# Patient Record
Sex: Female | Born: 1975 | Race: Black or African American | Hispanic: No | Marital: Single | Smoking: Never smoker
Health system: Southern US, Community
[De-identification: ages and names within clinical notes are randomized; demographics above are authoritative.]

## PROBLEM LIST (undated history)

## (undated) DIAGNOSIS — G8929 Other chronic pain: Secondary | ICD-10-CM

## (undated) DIAGNOSIS — F419 Anxiety disorder, unspecified: Secondary | ICD-10-CM

## (undated) DIAGNOSIS — D649 Anemia, unspecified: Secondary | ICD-10-CM

## (undated) DIAGNOSIS — N390 Urinary tract infection, site not specified: Secondary | ICD-10-CM

## (undated) DIAGNOSIS — I10 Essential (primary) hypertension: Secondary | ICD-10-CM

## (undated) DIAGNOSIS — R51 Headache: Secondary | ICD-10-CM

## (undated) DIAGNOSIS — L309 Dermatitis, unspecified: Secondary | ICD-10-CM

## (undated) HISTORY — DX: Dermatitis, unspecified: L30.9

## (undated) HISTORY — DX: Anxiety disorder, unspecified: F41.9

## (undated) HISTORY — DX: Headache: R51

## (undated) HISTORY — PX: TUBAL LIGATION: SHX77

## (undated) HISTORY — DX: Urinary tract infection, site not specified: N39.0

## (undated) HISTORY — DX: Essential (primary) hypertension: I10

## (undated) HISTORY — DX: Other chronic pain: G89.29

## (undated) HISTORY — DX: Anemia, unspecified: D64.9

---

## 1997-11-27 ENCOUNTER — Emergency Department (HOSPITAL_COMMUNITY): Admission: EM | Admit: 1997-11-27 | Discharge: 1997-11-27 | Payer: Self-pay | Admitting: Internal Medicine

## 1998-11-28 ENCOUNTER — Encounter: Payer: Self-pay | Admitting: Obstetrics

## 1998-11-28 ENCOUNTER — Inpatient Hospital Stay (HOSPITAL_COMMUNITY): Admission: AD | Admit: 1998-11-28 | Discharge: 1998-11-28 | Payer: Self-pay | Admitting: Obstetrics

## 1998-11-29 ENCOUNTER — Inpatient Hospital Stay (HOSPITAL_COMMUNITY): Admission: AD | Admit: 1998-11-29 | Discharge: 1998-11-29 | Payer: Self-pay | Admitting: Obstetrics

## 1998-12-05 ENCOUNTER — Ambulatory Visit (HOSPITAL_COMMUNITY): Admission: RE | Admit: 1998-12-05 | Discharge: 1998-12-05 | Payer: Self-pay | Admitting: Obstetrics

## 1999-06-09 ENCOUNTER — Inpatient Hospital Stay (HOSPITAL_COMMUNITY): Admission: AD | Admit: 1999-06-09 | Discharge: 1999-06-11 | Payer: Self-pay | Admitting: Obstetrics

## 1999-06-12 ENCOUNTER — Encounter: Admission: RE | Admit: 1999-06-12 | Discharge: 1999-08-22 | Payer: Self-pay | Admitting: Obstetrics

## 2000-04-30 ENCOUNTER — Emergency Department (HOSPITAL_COMMUNITY): Admission: EM | Admit: 2000-04-30 | Discharge: 2000-04-30 | Payer: Self-pay | Admitting: *Deleted

## 2005-01-26 ENCOUNTER — Ambulatory Visit: Payer: Self-pay | Admitting: Certified Nurse Midwife

## 2005-01-26 ENCOUNTER — Inpatient Hospital Stay (HOSPITAL_COMMUNITY): Admission: AD | Admit: 2005-01-26 | Discharge: 2005-01-29 | Payer: Self-pay | Admitting: *Deleted

## 2005-02-08 ENCOUNTER — Ambulatory Visit: Payer: Self-pay | Admitting: Family Medicine

## 2005-02-22 ENCOUNTER — Ambulatory Visit: Payer: Self-pay | Admitting: Family Medicine

## 2005-02-23 ENCOUNTER — Ambulatory Visit: Payer: Self-pay | Admitting: Obstetrics & Gynecology

## 2005-02-23 ENCOUNTER — Inpatient Hospital Stay (HOSPITAL_COMMUNITY): Admission: AD | Admit: 2005-02-23 | Discharge: 2005-02-25 | Payer: Self-pay | Admitting: Obstetrics & Gynecology

## 2005-03-01 ENCOUNTER — Ambulatory Visit: Payer: Self-pay | Admitting: Family Medicine

## 2005-03-08 ENCOUNTER — Ambulatory Visit: Payer: Self-pay | Admitting: *Deleted

## 2005-03-19 ENCOUNTER — Inpatient Hospital Stay (HOSPITAL_COMMUNITY): Admission: AD | Admit: 2005-03-19 | Discharge: 2005-03-19 | Payer: Self-pay | Admitting: *Deleted

## 2005-03-19 ENCOUNTER — Ambulatory Visit: Payer: Self-pay | Admitting: Family Medicine

## 2005-03-22 ENCOUNTER — Ambulatory Visit: Payer: Self-pay | Admitting: Family Medicine

## 2005-04-12 ENCOUNTER — Ambulatory Visit: Payer: Self-pay | Admitting: Family Medicine

## 2005-04-19 ENCOUNTER — Ambulatory Visit: Payer: Self-pay | Admitting: *Deleted

## 2005-04-24 ENCOUNTER — Inpatient Hospital Stay (HOSPITAL_COMMUNITY): Admission: AD | Admit: 2005-04-24 | Discharge: 2005-04-24 | Payer: Self-pay | Admitting: *Deleted

## 2005-04-24 ENCOUNTER — Inpatient Hospital Stay (HOSPITAL_COMMUNITY): Admission: AD | Admit: 2005-04-24 | Discharge: 2005-04-27 | Payer: Self-pay | Admitting: *Deleted

## 2005-04-24 ENCOUNTER — Ambulatory Visit: Payer: Self-pay | Admitting: *Deleted

## 2010-06-21 ENCOUNTER — Emergency Department (HOSPITAL_COMMUNITY)
Admission: EM | Admit: 2010-06-21 | Discharge: 2010-06-21 | Disposition: A | Discharge status: Home / Self Care | Payer: Managed Care, Other (non HMO) | Attending: Emergency Medicine | Admitting: Emergency Medicine

## 2010-06-21 DIAGNOSIS — I10 Essential (primary) hypertension: Secondary | ICD-10-CM | POA: Insufficient documentation

## 2010-06-21 DIAGNOSIS — R55 Syncope and collapse: Secondary | ICD-10-CM | POA: Insufficient documentation

## 2010-06-21 DIAGNOSIS — R42 Dizziness and giddiness: Secondary | ICD-10-CM | POA: Insufficient documentation

## 2010-06-21 LAB — CBC
HCT: 34.7 % — ABNORMAL LOW (ref 36.0–46.0)
Hemoglobin: 11.8 g/dL — ABNORMAL LOW (ref 12.0–15.0)
MCH: 26.1 pg (ref 26.0–34.0)
MCHC: 34 g/dL (ref 30.0–36.0)
RBC: 4.52 MIL/uL (ref 3.87–5.11)
WBC: 6.4 10*3/uL (ref 4.0–10.5)

## 2010-06-21 LAB — DIFFERENTIAL
Basophils Relative: 0 % (ref 0–1)
Eosinophils Absolute: 0 10*3/uL (ref 0.0–0.7)
Eosinophils Relative: 0 % (ref 0–5)
Lymphs Abs: 2.5 10*3/uL (ref 0.7–4.0)

## 2010-06-21 LAB — URINALYSIS, ROUTINE W REFLEX MICROSCOPIC
Nitrite: NEGATIVE
Protein, ur: NEGATIVE mg/dL
Urine Glucose, Fasting: NEGATIVE mg/dL

## 2010-06-21 LAB — POCT CARDIAC MARKERS
Myoglobin, poc: 45.8 ng/mL (ref 12–200)
Troponin i, poc: <0.05 ng/mL (ref 0.00–0.09)

## 2010-06-21 LAB — POCT I-STAT, CHEM 8
Calcium, Ion: 1.25 mmol/L (ref 1.12–1.32)
Chloride: 103 mEq/L (ref 96–112)
Glucose, Bld: 95 mg/dL (ref 70–99)
HCT: 39 % (ref 36.0–46.0)
Hemoglobin: 13.3 g/dL (ref 12.0–15.0)
TCO2: 25 mmol/L (ref 0–100)

## 2010-06-21 LAB — GLUCOSE, CAPILLARY: Glucose-Capillary: 100 mg/dL — ABNORMAL HIGH (ref 70–99)

## 2010-12-01 ENCOUNTER — Emergency Department (HOSPITAL_COMMUNITY)
Admission: EM | Admit: 2010-12-01 | Discharge: 2010-12-01 | Disposition: A | Discharge status: Home / Self Care | Payer: Managed Care, Other (non HMO) | Attending: Emergency Medicine | Admitting: Emergency Medicine

## 2010-12-01 DIAGNOSIS — M7989 Other specified soft tissue disorders: Secondary | ICD-10-CM | POA: Insufficient documentation

## 2010-12-01 DIAGNOSIS — I10 Essential (primary) hypertension: Secondary | ICD-10-CM | POA: Insufficient documentation

## 2010-12-01 DIAGNOSIS — M25579 Pain in unspecified ankle and joints of unspecified foot: Secondary | ICD-10-CM | POA: Insufficient documentation

## 2010-12-01 DIAGNOSIS — L659 Nonscarring hair loss, unspecified: Secondary | ICD-10-CM | POA: Insufficient documentation

## 2010-12-01 DIAGNOSIS — R209 Unspecified disturbances of skin sensation: Secondary | ICD-10-CM | POA: Insufficient documentation

## 2010-12-01 DIAGNOSIS — L851 Acquired keratosis [keratoderma] palmaris et plantaris: Secondary | ICD-10-CM | POA: Insufficient documentation

## 2010-12-01 DIAGNOSIS — M2550 Pain in unspecified joint: Secondary | ICD-10-CM | POA: Insufficient documentation

## 2010-12-01 DIAGNOSIS — R5383 Other fatigue: Secondary | ICD-10-CM | POA: Insufficient documentation

## 2010-12-01 DIAGNOSIS — R5381 Other malaise: Secondary | ICD-10-CM | POA: Insufficient documentation

## 2010-12-01 LAB — POCT I-STAT, CHEM 8
BUN: 8 mg/dL (ref 6–23)
Calcium, Ion: 1.28 mmol/L (ref 1.12–1.32)
HCT: 33 % — ABNORMAL LOW (ref 36.0–46.0)
Hemoglobin: 11.2 g/dL — ABNORMAL LOW (ref 12.0–15.0)
Sodium: 141 mEq/L (ref 135–145)
TCO2: 25 mmol/L (ref 0–100)

## 2010-12-01 LAB — POCT PREGNANCY, URINE: Preg Test, Ur: NEGATIVE

## 2010-12-04 ENCOUNTER — Encounter: Payer: Self-pay | Admitting: Medical

## 2010-12-04 ENCOUNTER — Ambulatory Visit (INDEPENDENT_AMBULATORY_CARE_PROVIDER_SITE_OTHER): Payer: Managed Care, Other (non HMO) | Admitting: Medical

## 2010-12-04 VITALS — BP 102/70 | HR 60 | Temp 98.0°F | Ht 66.0 in | Wt 124.0 lb

## 2010-12-04 DIAGNOSIS — R609 Edema, unspecified: Secondary | ICD-10-CM

## 2010-12-04 DIAGNOSIS — D649 Anemia, unspecified: Secondary | ICD-10-CM

## 2010-12-04 DIAGNOSIS — R634 Abnormal weight loss: Secondary | ICD-10-CM

## 2010-12-04 DIAGNOSIS — R5381 Other malaise: Secondary | ICD-10-CM

## 2010-12-04 DIAGNOSIS — R5383 Other fatigue: Secondary | ICD-10-CM

## 2010-12-04 LAB — POCT URINALYSIS DIPSTICK
Bilirubin, UA: NEGATIVE
Blood, UA: NEGATIVE
Glucose, UA: NEGATIVE
Leukocytes, UA: NEGATIVE
Nitrite, UA: NEGATIVE
Urobilinogen, UA: NEGATIVE
pH, UA: 6

## 2010-12-04 NOTE — Progress Notes (Signed)
Subjective:   HPI  Jennifer Solomon is a 35 y.o. female who presents as a new patient for hospital f/u.   Was seen 12/01/10 at [facility] for feet and hand swelling and feet tingling, stiffness in joints.  She notes that no etiology was determined, and advised to establish with primary doctor to further investigate.  Since the ED visit, has tried to elevated legs which has helped.  Was given Ibuprofen at the hospital for soreness which has helped.  Currently things seem to be back to normal.   No other aggravating or relieving factors.  No other c/o.  She does note hx/o being on iron therapy for anemia.  She was anemic in all 3 of her pregnancies.  She denies hx/o colonoscopy or EGD.  Last pap smear 5 years ago. No prior mammogram.  In general she notes that she eats healthy.  The following portions of the patient's history were reviewed and updated as appropriate: allergies, current medications, past family history, past medical history, past social history, past surgical history and problem list.  Past Medical History  Diagnosis Date  . Hypertension   . Urinary tract infection, chronic   . Chronic headaches   . Anemia   . Anxiety     Review of Systems Constitutional: +fatigue, night sweats, some wt loss 10lb since April, decreased appetite; denies fever, chills Allergy: no congestion, sneezing Dermatology: mild eczema ENT: no runny nose, ear pain, sore throat, hoarseness, sinus pain Cardiology: denies chest pain, palpitations Respiratory: denies cough, shortness of breath, wheezing Gastroenterology: denies abdominal pain, nausea, vomiting, diarrhea, constipation Hematology: denies bleeding or bruising problems Musculoskeletal: denies arthralgias, myalgias, joint swelling, back pain Ophthalmology: denies vision changes Urology: denies dysuria, difficulty urinating, hematuria, urinary frequency, urgency Neurology: no headache, weakness, tingling, numbness    Objective:   Physical Exam  General appearance: alert, no distress, WD/WN, black female HEENT: normocephalic, sclerae anicteric, PERRLA, EOMi, nares patent, no discharge or erythema, pharynx normal Oral cavity: MMM, no lesions Neck: supple, no lymphadenopathy, no thyromegaly, no masses, no bruits Heart: RRR, normal S1, S2, no murmurs Lungs: CTA bilaterally, no wheezes, rhonchi, or rales Abdomen: +bs, soft, non tender, non distended, no masses, no hepatomegaly, no splenomegaly, no bruits Back: non tender Musculoskeletal: nontender, no swelling, no obvious deformity Extremities: no edema, no cyanosis, no clubbing Pulses: 2+ symmetric, upper and lower extremities, normal cap refill Neurological: alert, oriented x 3, CN2-12 intact, strength normal upper extremities and lower extremities, sensation normal throughout, DTRs 2+ throughout, no cerebellar signs, gait normal Psychiatric: normal affect, behavior normal, pleasant  Recta: anus with small external hemorrhoid, occult negative stool (exam chaperoned by nurse)   Assessment :    Encounter Diagnoses  Name Primary?  . Fatigue Yes  . Edema   . Weight loss   . Anemia      Plan:    Reviewed her recent ED report and labs which showed some anemia.  We will get additional labs.  If labs all normal, I asked her to come by in 3-4 wk for weight check.  If continuing to lose weight, we will need to get additional workup.  Pending labs, may need to start iron as well.  Advised she exercise daily in general.  Discussed diet and get iron in diet.  Return soon to update pap smear as she is past due.

## 2010-12-05 ENCOUNTER — Telehealth: Payer: Self-pay | Admitting: Medical

## 2010-12-05 LAB — COMPREHENSIVE METABOLIC PANEL
ALT: 20 U/L (ref 0–35)
AST: 20 U/L (ref 0–37)
Alkaline Phosphatase: 48 U/L (ref 39–117)
Creat: 0.51 mg/dL (ref 0.50–1.10)
Sodium: 140 mEq/L (ref 135–145)
Total Bilirubin: 0.3 mg/dL (ref 0.3–1.2)
Total Protein: 7.3 g/dL (ref 6.0–8.3)

## 2010-12-05 LAB — CBC WITH DIFFERENTIAL/PLATELET
Basophils Absolute: 0 10*3/uL (ref 0.0–0.1)
Eosinophils Absolute: 0.1 10*3/uL (ref 0.0–0.7)
Eosinophils Relative: 1 % (ref 0–5)
HCT: 32 % — ABNORMAL LOW (ref 36.0–46.0)
Lymphocytes Relative: 28 % (ref 12–46)
MCH: 25.4 pg — ABNORMAL LOW (ref 26.0–34.0)
MCV: 75.8 fL — ABNORMAL LOW (ref 78.0–100.0)
Monocytes Absolute: 0.3 10*3/uL (ref 0.1–1.0)
Platelets: 392 10*3/uL (ref 150–400)
RDW: 15.2 % (ref 11.5–15.5)

## 2010-12-05 LAB — FOLATE: Folate: 13 ng/mL

## 2010-12-05 LAB — TSH: TSH: 2.16 u[IU]/mL (ref 0.350–4.500)

## 2010-12-05 LAB — IBC PANEL
%SAT: 25 % (ref 20–55)
TIBC: 350 ug/dL (ref 250–470)

## 2010-12-05 NOTE — Telephone Encounter (Signed)
I called and spoke to pt.  Advised that hemoglobin had dropped a point since her recent ED visit.  Her hemoglobin was low in the 10 range and was microcytic, iron and iron sat on low end of normal.  Otherwise liver, kidney, lytes, thyroid, folate, b12, urine all normal.  I asked her to come in for CBC no diff in 1-2 wk, weight check in 69mo, return at her convenience for pap smear.  I also asked her to start OTC iron BID for now.  She agrees with plan.  Of note, she takes NSAID periodically, but not routinely.  No current bleeding c/o.

## 2011-01-24 ENCOUNTER — Ambulatory Visit (INDEPENDENT_AMBULATORY_CARE_PROVIDER_SITE_OTHER): Payer: Managed Care, Other (non HMO) | Admitting: Medical

## 2011-01-24 ENCOUNTER — Other Ambulatory Visit (HOSPITAL_COMMUNITY)
Admission: RE | Admit: 2011-01-24 | Discharge: 2011-01-24 | Disposition: A | Discharge status: Home / Self Care | Payer: Managed Care, Other (non HMO) | Source: Ambulatory Visit | Attending: Medical | Admitting: Medical

## 2011-01-24 ENCOUNTER — Encounter: Payer: Self-pay | Admitting: Medical

## 2011-01-24 DIAGNOSIS — D649 Anemia, unspecified: Secondary | ICD-10-CM

## 2011-01-24 DIAGNOSIS — Z23 Encounter for immunization: Secondary | ICD-10-CM

## 2011-01-24 DIAGNOSIS — Z113 Encounter for screening for infections with a predominantly sexual mode of transmission: Secondary | ICD-10-CM

## 2011-01-24 DIAGNOSIS — Z9229 Personal history of other drug therapy: Secondary | ICD-10-CM

## 2011-01-24 DIAGNOSIS — N898 Other specified noninflammatory disorders of vagina: Secondary | ICD-10-CM

## 2011-01-24 DIAGNOSIS — Z0000 Encounter for general adult medical examination without abnormal findings: Secondary | ICD-10-CM

## 2011-01-24 DIAGNOSIS — Z01419 Encounter for gynecological examination (general) (routine) without abnormal findings: Secondary | ICD-10-CM | POA: Insufficient documentation

## 2011-01-24 LAB — POCT URINALYSIS DIPSTICK
Bilirubin, UA: NEGATIVE
Blood, UA: NEGATIVE
Glucose, UA: NEGATIVE
Ketones, UA: NEGATIVE
Nitrite, UA: NEGATIVE

## 2011-01-24 NOTE — Progress Notes (Addendum)
Subjective:   HPI  Jennifer Solomon is a 35 y.o. female who presents for a complete physical.  Here for recheck on anemia.  Last saw her in July for anemia, and she began OTC iron BID at that time.  She notes prior hx/o anemia with each pregnancy.  Denies blood loss, bleeding, bruising.   She thinks she may have been told about sickle cell trait prior, but not sure.   Mom and other family had iron deficiency anemia.    She notes hx/o HTN, but not on medication.  Hx/o UTI recurrent, but no UTI in the last year.  Has hx/o eczema with occasional flare.  Uses Eucerin cream and OTC lotion to help keep it controlled.  Denies any current anxiety problems.   She note hx/o chronic headaches, gets bad headache occasional including last week, but not more than weekly.    She does note white thick vaginal discharge intermittent for weeks, seems to be worse before and after period.  Denies itching.  She notes that she has had yeast infection in the past, but no current yeast symptoms.  LMP last week.    Denies hx/o STD.  Declines flu shot.   Reviewed their medical, surgical, family, social, medication, and allergy history and updated chart as appropriate.  Past Medical History  Diagnosis Date  . Urinary tract infection, chronic   . Chronic headaches   . Anemia   . Eczema   . Hypertension     in remote past, resolved  . Anxiety     very mild    Past Surgical History  Procedure Date  . Tubal ligation     Family History  Problem Relation Age of Onset  . Thyroid disease Mother   . Hypertension Mother   . Cancer Mother     cervical  . Hypothyroidism Mother   . Stroke Neg Hx   . Cancer Maternal Grandmother     cervical  . Heart disease Maternal Grandmother   . Diabetes Maternal Grandmother     History   Social History  . Marital Status: Single    Spouse Name: N/A    Number of Children: N/A  . Years of Education: N/A   Occupational History  . Not on file.   Social History  Main Topics  . Smoking status: Never Smoker   . Smokeless tobacco: Never Used  . Alcohol Use: 0.6 oz/week    1 Glasses of wine per week     2- 3 drinks a month  . Drug Use: No  . 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 Not on file   Social History Narrative  . No narrative on file    No current outpatient prescriptions on file prior to visit.    No Known Allergies   Review of Systems Constitutional: denies fever, chills, sweats, unexpected weight change, anorexia, fatigue Allergy: negative; denies recent sneezing, itching, congestion Dermatology: eczema flare occasionally on bilat arm flexor surface and abdomen; denies changing moles, lumps, new worrisome lesions ENT: no runny nose, ear pain, sore throat, hoarseness, sinus pain, teeth pain, tinnitus, hearing loss, epistaxis Cardiology: denies chest pain, palpitations, edema Respiratory: denies cough, shortness of breath, dyspnea on exertion, wheezing, hemoptysis Gastroenterology: +occasional constipation; denies abdominal pain, nausea, vomiting, diarrhea, blood in stool, changes in bowel movement, dysphagia Hematology: denies bleeding or bruising problems Musculoskeletal: denies arthralgias,  myalgias, joint swelling, back pain, neck pain, cramping, gait changes Ophthalmology: denies vision changes, eye redness, itching, discharge Urology: denies dysuria, difficulty urinating, hematuria, urinary frequency, urgency, incontinence Neurology: no weakness, tingling, numbness, dizziness Psychology: denies depressed mood, agitation, sleep problems     Objective:   Physical Exam  Filed Vitals:   01/24/11 1115  BP: 120/80  Pulse: 60  Temp: 98.1 F (36.7 C)  Resp: 20    General appearance: alert, no distress, WD/WN, black female , looks stated age HEENT: normocephalic, conjunctiva/corneas normal, sclerae anicteric, PERRLA, EOMi,  nares patent, no discharge or erythema, pharynx normal Oral cavity: MMM, tongue normal, teeth normal Neck: supple, no lymphadenopathy, no thyromegaly, no masses, normal ROM Chest: non tender, normal shape and expansion Heart: RRR, normal S1, S2, no murmurs Lungs: CTA bilaterally, no wheezes, rhonchi, or rales Abdomen: +bs, soft, non tender, non distended, no masses, no hepatomegaly, no splenomegaly, no bruits Back: non tender, normal ROM, no scoliosis Musculoskeletal: upper extremities non tender, no obvious deformity, normal ROM throughout, lower extremities non tender, no obvious deformity, normal ROM throughout Extremities: no edema, no cyanosis, no clubbing Pulses: 2+ symmetric, upper and lower extremities, normal cap refill Neurological: alert, oriented x 3, CN2-12 intact, strength normal upper extremities and lower extremities, sensation normal throughout, DTRs 2+ throughout, no cerebellar signs, gait normal Psychiatric: normal affect, behavior normal, pleasant  Breasts: no mass or nodules, no discharge, no axillary lymphadenopathy, unremakrable Gyn: creamy white discharge from vagina on initial exam, otherwise external vagina unremarkable, more thick white discharge on speculum exam, cervix otherwise normal, no adnexal mass or tenderness, uterus normal size, swabs taken, exam chaperoned by nurse   Assessment :    Encounter Diagnoses  Name Primary?  . General medical examination Yes  . Screening for venereal disease   . Anemia   . Vaginal discharge   . Immunizations up to date       Plan:    Physical exam - discussed healthy lifestyle, diet, exercise, preventative care, vaccinations, and addressed their concerns.  Tdap and VIS given today.  STD screening today.  Wet prep today with some yeast present, +whiff test, scattered epithelial cells, otherwise no trichomonads.  Anemia - recheck labs today.  She has been taking OTC iron BID.    Vaginal discharge - pending  labs.

## 2011-01-24 NOTE — Patient Instructions (Signed)

## 2011-01-25 LAB — CBC WITH DIFFERENTIAL/PLATELET
Basophils Relative: 1 % (ref 0–1)
Eosinophils Absolute: 0 10*3/uL (ref 0.0–0.7)
Eosinophils Relative: 1 % (ref 0–5)
HCT: 34.8 % — ABNORMAL LOW (ref 36.0–46.0)
Hemoglobin: 11 g/dL — ABNORMAL LOW (ref 12.0–15.0)
MCH: 24.9 pg — ABNORMAL LOW (ref 26.0–34.0)
MCHC: 31.6 g/dL (ref 30.0–36.0)
MCV: 78.9 fL (ref 78.0–100.0)
Monocytes Absolute: 0.3 10*3/uL (ref 0.1–1.0)
Monocytes Relative: 7 % (ref 3–12)
RDW: 15.9 % — ABNORMAL HIGH (ref 11.5–15.5)

## 2011-01-25 LAB — GC/CHLAMYDIA PROBE AMP, GENITAL
Chlamydia, DNA Probe: NEGATIVE
GC Probe Amp, Genital: NEGATIVE

## 2011-01-25 LAB — RPR

## 2011-01-25 LAB — LIPID PANEL
LDL Cholesterol: 109 mg/dL — ABNORMAL HIGH (ref 0–99)
VLDL: 18 mg/dL (ref 0–40)

## 2011-01-25 LAB — LACTATE DEHYDROGENASE: LDH: 156 U/L (ref 94–250)

## 2011-01-26 ENCOUNTER — Telehealth: Payer: Self-pay | Admitting: *Deleted

## 2011-01-26 ENCOUNTER — Telehealth: Payer: Self-pay | Admitting: Medical

## 2011-01-26 ENCOUNTER — Other Ambulatory Visit: Payer: Self-pay | Admitting: Medical

## 2011-01-26 MED ORDER — OMEPRAZOLE 40 MG PO CPDR: DELAYED_RELEASE_CAPSULE | ORAL | Status: DC

## 2011-01-26 MED ORDER — FLUCONAZOLE 150 MG PO TABS: ORAL_TABLET | ORAL | Status: DC

## 2011-01-26 NOTE — Telephone Encounter (Signed)
No answer

## 2011-01-26 NOTE — Telephone Encounter (Addendum)
Message copied by Dorthula Perfect on Fri Jan 26, 2011  8:11 AM ------      Message from: Aleen Campi, DAVID S      Created: Fri Jan 26, 2011  8:00 AM       Still pending pap smear.  I'm going to go ahead and treat her for yeast infection, as this is what was seen on microscope here.   All her STD labs were negative - HIV, syphilis, chlamydia, gonorrhea.  Her anemia has improved a little.  Her other labs all look good.             I want her to continue taking iron OTC for now.  I also want to start her on omeprazole once daily 40-60 minutes before breakfast.  The reason for the omeprazole is to help cut down on any ulcer that may have formed that could be related to the anemia.  Lets try this along with continuing iron BID.   Lets recheck her CBC in 38mo (nurse visit).              Thus, Diflucan for yeast and Omeprazole will be sent to pharmacy.  We will call with pap smear results when they come in.  See if she has any questions.  Pt notified of lab results.  Pt agreed to take OTC iron BID, Omeprazole, and Diflucan.  Pt will call back to schedule a nurse visit for CBC in 2 months.  CM, LPN

## 2011-01-29 ENCOUNTER — Telehealth: Payer: Self-pay | Admitting: *Deleted

## 2011-01-29 NOTE — Telephone Encounter (Addendum)
Message copied by Dorthula Perfect on Mon Jan 29, 2011 11:18 AM ------      Message from: Jac Canavan      Created: Fri Jan 26, 2011  5:39 PM       Pap smear normal but yeast was seen.  See other result notes, call pt.   Pt notified of lab results.  CM,LPN

## 2012-05-03 ENCOUNTER — Encounter (HOSPITAL_COMMUNITY): Payer: Self-pay | Admitting: Emergency Medicine

## 2012-05-03 ENCOUNTER — Emergency Department (HOSPITAL_COMMUNITY): Payer: Managed Care, Other (non HMO)

## 2012-05-03 ENCOUNTER — Emergency Department (HOSPITAL_COMMUNITY)
Admission: EM | Admit: 2012-05-03 | Discharge: 2012-05-03 | Disposition: A | Discharge status: Home / Self Care | Payer: Managed Care, Other (non HMO) | Attending: Emergency Medicine | Admitting: Emergency Medicine

## 2012-05-03 DIAGNOSIS — J3489 Other specified disorders of nose and nasal sinuses: Secondary | ICD-10-CM | POA: Insufficient documentation

## 2012-05-03 DIAGNOSIS — Z8659 Personal history of other mental and behavioral disorders: Secondary | ICD-10-CM | POA: Insufficient documentation

## 2012-05-03 DIAGNOSIS — IMO0001 Reserved for inherently not codable concepts without codable children: Secondary | ICD-10-CM | POA: Insufficient documentation

## 2012-05-03 DIAGNOSIS — R059 Cough, unspecified: Secondary | ICD-10-CM | POA: Insufficient documentation

## 2012-05-03 DIAGNOSIS — R05 Cough: Secondary | ICD-10-CM | POA: Insufficient documentation

## 2012-05-03 DIAGNOSIS — R509 Fever, unspecified: Secondary | ICD-10-CM | POA: Insufficient documentation

## 2012-05-03 DIAGNOSIS — Z8679 Personal history of other diseases of the circulatory system: Secondary | ICD-10-CM | POA: Insufficient documentation

## 2012-05-03 DIAGNOSIS — R5381 Other malaise: Secondary | ICD-10-CM | POA: Insufficient documentation

## 2012-05-03 DIAGNOSIS — I10 Essential (primary) hypertension: Secondary | ICD-10-CM | POA: Insufficient documentation

## 2012-05-03 DIAGNOSIS — J40 Bronchitis, not specified as acute or chronic: Secondary | ICD-10-CM | POA: Insufficient documentation

## 2012-05-03 DIAGNOSIS — Z872 Personal history of diseases of the skin and subcutaneous tissue: Secondary | ICD-10-CM | POA: Insufficient documentation

## 2012-05-03 DIAGNOSIS — Z87440 Personal history of urinary (tract) infections: Secondary | ICD-10-CM | POA: Insufficient documentation

## 2012-05-03 DIAGNOSIS — Z862 Personal history of diseases of the blood and blood-forming organs and certain disorders involving the immune mechanism: Secondary | ICD-10-CM | POA: Insufficient documentation

## 2012-05-03 DIAGNOSIS — R6889 Other general symptoms and signs: Secondary | ICD-10-CM

## 2012-05-03 MED ORDER — NAPROXEN 500 MG PO TABS: 500.0000 mg | ORAL_TABLET | Freq: Two times a day (BID) | ORAL | Status: DC

## 2012-05-03 MED ORDER — HYDROCODONE-HOMATROPINE 5-1.5 MG/5ML PO SYRP: 5.0000 mL | ORAL_SOLUTION | Freq: Four times a day (QID) | ORAL | Status: DC | PRN

## 2012-05-03 MED ORDER — FLUTICASONE PROPIONATE 50 MCG/ACT NA SUSP: 2.0000 | Freq: Every day | NASAL | Status: DC

## 2012-05-03 NOTE — ED Notes (Signed)
Pt presents w/ body aches, chills and fever off and on. Now w/croupy, loose cough, non-productive, sore throat and ears feel full. This started last Sunday following the week her children all had the same Sx

## 2012-05-03 NOTE — ED Provider Notes (Signed)
Medical screening examination/treatment/procedure(s) were performed by non-physician practitioner and as supervising physician I was immediately available for consultation/collaboration.   Santosha Jividen, MD 05/03/12 1512 

## 2012-05-03 NOTE — ED Provider Notes (Signed)
History     CSN: [id number]  Arrival date & time 05/03/12  1044   First MD Initiated Contact with Patient 05/03/12 1123      Chief Complaint  Patient presents with  . Influenza    (Consider location/radiation/quality/duration/timing/severity/associated sxs/prior treatment) HPI Comments: Jennifer Solomon is a 36 y.o. female who present complaining of flu-like symptoms: low grade fevers, chills, myalgias, congestion, sore throat and cough for 5 days. Denies chest pain, N/V/D, dyspnea or wheezing. Positive sick contact in house.   Patient is a 37 y.o. female presenting with flu symptoms. The history is provided by the patient.  Influenza Associated symptoms include chills, coughing, fatigue, a fever and myalgias. Pertinent negatives include no abdominal pain, chest pain, congestion, nausea, neck pain, rash, sore throat, vomiting or weakness.    Past Medical History  Diagnosis Date  . Urinary tract infection, chronic   . Chronic headaches   . Anemia   . Eczema   . Hypertension     in remote past, resolved  . Anxiety     very mild    Past Surgical History  Procedure Date  . Tubal ligation     Family History  Problem Relation Age of Onset  . Thyroid disease Mother   . Hypertension Mother   . Cancer Mother     cervical  . Hypothyroidism Mother   . Stroke Neg Hx   . Cancer Maternal Grandmother     cervical  . Heart disease Maternal Grandmother   . Diabetes Maternal Grandmother     History  Substance Use Topics  . Smoking status: Never Smoker   . Smokeless tobacco: Never Used  . Alcohol Use: 0.6 oz/week    1 Glasses of wine per week     Comment: 2- 3 drinks a month    OB History    Grav Para Term Preterm Abortions TAB SAB Ect Mult Living                  Review of Systems  Constitutional: Positive for fever, chills and fatigue.  HENT: Negative for ear pain, congestion, sore throat, rhinorrhea, sneezing, neck pain, neck stiffness, sinus pressure and  tinnitus.   Eyes: Negative for visual disturbance.  Respiratory: Positive for cough.   Cardiovascular: Negative for chest pain and palpitations.  Gastrointestinal: Negative for nausea, vomiting, abdominal pain and diarrhea.  Genitourinary: Negative for dysuria.  Musculoskeletal: Positive for myalgias.  Skin: Negative for color change and rash.  Neurological: Negative for dizziness and weakness.  Hematological: Does not bruise/bleed easily.  Psychiatric/Behavioral: Negative for confusion.  All other systems reviewed and are negative.    Allergies  Review of patient's allergies indicates no known allergies.  Home Medications   Current Outpatient Rx  Name  Route  Sig  Dispense  Refill  . THERAFLU FLU/COLD PO   Oral   Take 1 packet by mouth daily as needed. For flu symptoms.         . NYQUIL PO   Oral   Take 15 mLs by mouth every 6 (six) hours as needed. For flu symptoms.           BP 129/80  Pulse 71  Temp 98.3 F (36.8 C) (Oral)  SpO2 100%  LMP 04/25/2012  Physical Exam  Nursing note and vitals reviewed. Constitutional: She is oriented to person, place, and time. She appears well-developed and well-nourished. No distress.  HENT:  Head: Normocephalic and atraumatic.       Normal  Oropharynx with out exudates, MMM. Ear canal and TM normal bilaterally. Nasal congestion without sinus tenderness.   Eyes: Conjunctivae normal and EOM are normal. Pupils are equal, round, and reactive to light.  Neck: Neck supple.       No nuchal rigidity, full normal ROM, pt can breath in extension without difficulty   Cardiovascular: Regular rhythm.        Tachycardic, other heart sounds normal  Pulmonary/Chest: Effort normal.       LCAB  Abdominal: Soft. Bowel sounds are normal.  Musculoskeletal: She exhibits no edema.       Diffuse myalgias, no bony ttp or decreased ROM  Neurological: She is oriented to person, place, and time.  Skin:       No rash, skin warm & non diaphoretic     ED Course  Procedures (including critical care time)  Labs Reviewed - No data to display Dg Chest 2 View  05/03/2012  *RADIOLOGY REPORT*  Clinical Data: Cough, congestion, fever  CHEST - 2 VIEW  Comparison: None.  Findings: No active infiltrate or effusion is seen.  Minimal peribronchial thickening is noted.  Mediastinal contours appear normal.  The heart is within normal limits in size.  IMPRESSION: No pneumonia.  Mild peribronchial thickening.   Original Report Authenticated By: Dwyane Dee, M.D.      No diagnosis found.    MDM  Patient with symptoms consistent with influenza.  Vitals are stable, low-grade fever.  No signs of dehydration, tolerating PO's.  Lungs are clear & CXR with out sign of PNA.  Discussed the cost versus benefit of Tamiflu treatment with the patient.  The patient understands that symptoms are greater than the recommended 24-48 hour window of treatment.  Patient will be discharged with instructions to orally hydrate, rest, and use over-the-counter medications such as anti-inflammatories ibuprofen and Aleve for muscle aches and Tylenol for fever.  Patient will also be given a cough suppressant.          Jennifer Solomon, [place] 05/03/12 1156

## 2012-07-15 ENCOUNTER — Ambulatory Visit (INDEPENDENT_AMBULATORY_CARE_PROVIDER_SITE_OTHER): Payer: 59 | Admitting: Medical

## 2012-07-15 ENCOUNTER — Encounter: Payer: Self-pay | Admitting: Medical

## 2012-07-15 VITALS — BP 110/70 | HR 72 | Temp 98.3°F

## 2012-07-15 DIAGNOSIS — L2089 Other atopic dermatitis: Secondary | ICD-10-CM

## 2012-07-15 DIAGNOSIS — R300 Dysuria: Secondary | ICD-10-CM

## 2012-07-15 DIAGNOSIS — L209 Atopic dermatitis, unspecified: Secondary | ICD-10-CM

## 2012-07-15 MED ORDER — BETAMETHASONE DIPROPIONATE 0.05 % EX CREA: TOPICAL_CREAM | Freq: Two times a day (BID) | CUTANEOUS | Status: DC

## 2012-07-15 MED ORDER — NITROFURANTOIN MONOHYD MACRO 100 MG PO CAPS: 100.0000 mg | ORAL_CAPSULE | Freq: Two times a day (BID) | ORAL | Status: DC

## 2012-07-15 NOTE — Patient Instructions (Signed)
Begin the steroid cream short term to calm the rash down.   It is also ok to keep using Benadryl at night for a week or two.    In general, do the following:  Drink plenty of water daily  Eat a healthy diet  Avoid hot showers  Use a daily moisturizing/emollient lotion such as Lubriderm or Aquafor or Aveeno  Use a mild hypoallergenic soap for daily hygiene such as Dove Sensitive Skin soap  If this is not helping in 2-3 weeks, then recheck.     Eczema Atopic dermatitis, or eczema, is an inherited type of sensitive skin. Often people with eczema have a family history of allergies, asthma, or hay fever. It causes a red itchy rash and dry scaly skin. The itchiness may occur before the skin rash and may be very intense. It is not contagious. Eczema is generally worse during the cooler winter months and often improves with the warmth of summer. Eczema usually starts showing signs in infancy. Some children outgrow eczema, but it may last through adulthood. Flare-ups may be caused by:  Eating something or contact with something you are sensitive or allergic to.  Stress. DIAGNOSIS  The diagnosis of eczema is usually based upon symptoms and medical history. TREATMENT  Eczema cannot be cured, but symptoms usually can be controlled with treatment or avoidance of allergens (things to which you are sensitive or allergic to).  Controlling the itching and scratching.  Use over-the-counter antihistamines as directed for itching. It is especially useful at night when the itching tends to be worse.  Use over-the-counter steroid creams as directed for itching.  Scratching makes the rash and itching worse and may cause impetigo (a skin infection) if fingernails are contaminated (dirty).  Keeping the skin well moisturized with creams every day. This will seal in moisture and help prevent dryness. Lotions containing alcohol and water can dry the skin and are not recommended.  Limiting exposure to  allergens.  Recognizing situations that cause stress.  Developing a plan to manage stress. HOME CARE INSTRUCTIONS   Take prescription and over-the-counter medicines as directed by your caregiver.  Do not use anything on the skin without checking with your caregiver.  Keep baths or showers short (5 minutes) in warm (not hot) water. Use mild cleansers for bathing. You may add non-perfumed bath oil to the bath water. It is best to avoid soap and bubble bath.  Immediately after a bath or shower, when the skin is still damp, apply a moisturizing ointment to the entire body. This ointment should be a petroleum ointment. This will seal in moisture and help prevent dryness. The thicker the ointment the better. These should be unscented.  Keep fingernails cut short and wash hands often. If your child has eczema, it may be necessary to put soft gloves or mittens on your child at night.  Dress in clothes made of cotton or cotton blends. Dress lightly, as heat increases itching.  Avoid foods that may cause flare-ups. Common foods include cow's milk, peanut butter, eggs and wheat.  Keep a child with eczema away from anyone with fever blisters. The virus that causes fever blisters (herpes simplex) can cause a serious skin infection in children with eczema. SEEK MEDICAL CARE IF:   Itching interferes with sleep.  The rash gets worse or is not better within one week following treatment.  The rash looks infected (pus or soft yellow scabs).  You or your child has an oral temperature above 102 F (38.9  C).  Your baby is older than 3 months with a rectal temperature of 100.5 F (38.1 C) or higher for more than 1 day.  The rash flares up after contact with someone who has fever blisters. SEEK IMMEDIATE MEDICAL CARE IF:   Your baby is older than 3 months with a rectal temperature of 102 F (38.9 C) or higher.  Your baby is older than 3 months or younger with a rectal temperature of 100.4 F (38  C) or higher. Document Released: 04/27/2000 Document Revised: 07/23/2011 Document Reviewed: 03/02/2009 [facility] Patient Information 2013 [place], [place].

## 2012-07-15 NOTE — Progress Notes (Signed)
Subjective:  Jennifer Solomon is a 37 y.o. female who complains of burning with urination and frequency. She has had symptoms for 1 week. Patient also complains of abdominal pressure, but the last few days symptoms have improved with increased water intake.. Patient denies back pain, fever and vaginal discharge. Patient does not have a history of recurrent UTI. Patient does not have a history of pyelonephritis.   Pt reports rash on arms and abdomen.   Has had this prior, diagnosed with eczema, given cream which cleared up the arms, but the abdominal area has never cleared up.  She denies hx/o allergies or asthma.   She has used various OTC creams and ointments, oatmeal baths, but nothing seems to help this.   Past Medical History  Diagnosis Date  . Urinary tract infection, chronic   . Chronic headaches   . Anemia   . Eczema   . Hypertension     in remote past, resolved  . Anxiety     very mild   ROS as in subjective  Objective:    Filed Vitals:   07/15/12 1413  BP: 110/70  Pulse: 72  Temp: 98.3 F (36.8 C)    General appearance: alert, no distress, WD/WN, female Skin: large patch of rough irritated skin on both anterolateral forearms and abdomen.  No scaling, no erythema Abdomen: +bs, soft, non tender, non distended, no masses, no hepatomegaly, no splenomegaly, no bruits Back: no CVA tenderness    Assessment and Plan:   Encounter Diagnoses  Name Primary?  Marland Kitchen Atopic dermatitis Yes  . Dysuria    Atopic dermatitis - discussed diagnosis, possible triggers, daily moisturizing lotion, and for flare ups can use short term Betamethasone cream sparingly.   Discussed risks/benefits of medication.   Dysuria - will send urine for culture.   Script for Macrobid in the event symptoms worsen, but sounds like symptoms are resolving on their own with increased water intake.

## 2012-07-17 LAB — URINE CULTURE: Organism ID, Bacteria: NO GROWTH

## 2012-07-21 ENCOUNTER — Encounter: Payer: Self-pay | Admitting: Family Medicine

## 2013-05-05 ENCOUNTER — Telehealth: Payer: Self-pay | Admitting: Medical

## 2013-05-05 ENCOUNTER — Other Ambulatory Visit: Payer: Self-pay | Admitting: Medical

## 2013-05-05 MED ORDER — BETAMETHASONE DIPROPIONATE 0.05 % EX CREA: TOPICAL_CREAM | Freq: Two times a day (BID) | CUTANEOUS | Status: DC

## 2013-05-05 NOTE — Telephone Encounter (Signed)
Called pt & informed of Rx

## 2013-05-05 NOTE — Telephone Encounter (Signed)
rx sent

## 2013-05-13 ENCOUNTER — Other Ambulatory Visit: Payer: Self-pay | Admitting: Medical

## 2013-05-13 ENCOUNTER — Telehealth: Payer: Self-pay | Admitting: Medical

## 2013-05-13 MED ORDER — BETAMETHASONE DIPROPIONATE 0.05 % EX CREA: TOPICAL_CREAM | Freq: Two times a day (BID) | CUTANEOUS | Status: DC

## 2013-05-13 NOTE — Telephone Encounter (Signed)
rx sent

## 2013-05-13 NOTE — Telephone Encounter (Signed)
Received fax refill request from [facility] concerning  Betamethasone dip 0.05% cream 15 GM    , per [facility] drug not available, please call/fax different Rx

## 2016-02-16 ENCOUNTER — Ambulatory Visit (INDEPENDENT_AMBULATORY_CARE_PROVIDER_SITE_OTHER): Payer: 59 | Admitting: Medical

## 2016-02-16 ENCOUNTER — Encounter: Payer: Self-pay | Admitting: Medical

## 2016-02-16 VITALS — BP 126/72 | HR 69 | Ht 66.0 in | Wt 151.2 lb

## 2016-02-16 DIAGNOSIS — K219 Gastro-esophageal reflux disease without esophagitis: Secondary | ICD-10-CM | POA: Diagnosis not present

## 2016-02-16 DIAGNOSIS — R0600 Dyspnea, unspecified: Secondary | ICD-10-CM | POA: Diagnosis not present

## 2016-02-16 DIAGNOSIS — Z566 Other physical and mental strain related to work: Secondary | ICD-10-CM | POA: Diagnosis not present

## 2016-02-16 DIAGNOSIS — R0789 Other chest pain: Secondary | ICD-10-CM | POA: Diagnosis not present

## 2016-02-16 DIAGNOSIS — G4489 Other headache syndrome: Secondary | ICD-10-CM | POA: Diagnosis not present

## 2016-02-16 LAB — BASIC METABOLIC PANEL
BUN: 10 mg/dL (ref 7–25)
CALCIUM: 9.6 mg/dL (ref 8.6–10.2)
CHLORIDE: 104 mmol/L (ref 98–110)
CO2: 19 mmol/L — AB (ref 20–31)
Creat: 0.6 mg/dL (ref 0.50–1.10)
Glucose, Bld: 99 mg/dL (ref 65–99)
POTASSIUM: 4.2 mmol/L (ref 3.5–5.3)
SODIUM: 137 mmol/L (ref 135–146)

## 2016-02-16 LAB — CBC
HEMATOCRIT: 36.9 % (ref 35.0–45.0)
HEMOGLOBIN: 12.6 g/dL (ref 11.7–15.5)
MCH: 26.7 pg — ABNORMAL LOW (ref 27.0–33.0)
MCHC: 34.1 g/dL (ref 32.0–36.0)
MCV: 78.2 fL — ABNORMAL LOW (ref 80.0–100.0)
MPV: 9.2 fL (ref 7.5–12.5)
Platelets: 389 10*3/uL (ref 140–400)
RBC: 4.72 MIL/uL (ref 3.80–5.10)
RDW: 14.9 % (ref 11.0–15.0)
WBC: 7.3 10*3/uL (ref 4.0–10.5)

## 2016-02-16 LAB — T4, FREE: FREE T4: 1.1 ng/dL (ref 0.8–1.8)

## 2016-02-16 LAB — TSH: TSH: 1.31 m[IU]/L

## 2016-02-16 LAB — D-DIMER, QUANTITATIVE (NOT AT ARMC): D DIMER QUANT: 0.44 ug{FEU}/mL (ref <0.50)

## 2016-02-16 LAB — SEDIMENTATION RATE: Sed Rate: 6 mm/hr (ref 0–20)

## 2016-02-16 MED ORDER — OMEPRAZOLE 40 MG PO CPDR: 40.0000 mg | DELAYED_RELEASE_CAPSULE | Freq: Every day | ORAL | 0 refills | Status: DC

## 2016-02-16 MED ORDER — ALPRAZOLAM 0.5 MG PO TABS: 0.5000 mg | ORAL_TABLET | Freq: Three times a day (TID) | ORAL | 0 refills | Status: DC | PRN

## 2016-02-16 NOTE — Progress Notes (Signed)
Subjective:    Jennifer Solomon is a 40 y.o. female who presents for evaluation of chest tightness.  Her last visit here was 2014. Onset was 1 week ago. Symptoms have been unchanged since that time. The patient describes the pain as tightness and radiates to the mid and left chest . Associated symptoms are: dyspnea, had 1 day of bad heartburn last week.  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  Denies recent use of OCPs, no recent surgery, long travel, stasis, trauma.  Lifetime nonsmoker. Using some tylenol and ibuprofen without relief.  Aggravating factors are: none. Alleviating factors are: none. Patient's cardiac risk factors are: none. Patient's risk factors for DVT/PE: none. Previous cardiac testing: none.  The following portions of the patient's history were reviewed and updated as appropriate: allergies, current medications, past family history, past medical history, past social history, past surgical history and problem list.  Review of Systems Constitutional: denies fever, chills, sweats, unexpected weight change, anorexia, fatigue ENT: no runny nose, ear pain, sore throat, hoarseness, sinus pain, hearing loss, epistaxis Cardiology: denies palpitations, edema, orthopnea, paroxysmal nocturnal dyspnea Respiratory: denies cough, +shortness of breath, -dyspnea on exertion, wheezing, hemoptysis Gastroenterology: denies abdominal pain, nausea, vomiting, diarrhea, constipation,  Hematology: denies bleeding or bruising problems Musculoskeletal: denies arthralgias, +myalgias,- joint swelling, back pain, neck pain, cramping, gait changes Ophthalmology: denies vision changes Urology: denies dysuria, difficulty urinating, hematuria, urinary frequency, urgency, incontinence Neurology: no headache, weakness, tingling, numbness, speech abnormality, memory loss, falls, dizziness Psychology: denies depressed mood,+ agitation, sleep  problems    Objective:   BP 126/72   Pulse 69   Ht 5\' 6"  (1.676 m)   Wt 151 lb 4 oz (68.6 kg)   SpO2 98%   BMI 24.41 kg/m   General appearance: alert, no distress, WD/WN,AA female  HEENT: normocephalic, conjunctiva/corneas normal, sclerae anicteric, PERRLA, EOMi, nares patent, no discharge or erythema, pharynx normal Oral cavity: MMM, tongue normal, teeth normal Neck: supple, no lymphadenopathy, no thyromegaly, no JVD, no bruits, no masses, normal ROM Chest: tender across upper mid and left chest wall, otherwise non tender, normal shape and expansion Heart: RRR, normal S1, S2, no murmurs Lungs: CTA bilaterally, no wheezes, rhonchi, or rales Abdomen: +bs, soft, non tender, non distended, no masses, no hepatomegaly, no splenomegaly, no bruits Back: non tender, normal ROM, no scoliosis Musculoskeletal: upper extremities non tender, no obvious deformity, normal ROM throughout, lower extremities non tender, no obvious deformity, normal ROM throughout Extremities: no edema, no cyanosis, no clubbing Pulses: 2+ symmetric, upper and lower extremities, normal cap refill Neurological: alert, oriented x 3, CN2-12 intact, strength normal upper extremities and lower extremities, sensation normal throughout, DTRs 2+ throughout, no cerebellar signs, gait normal Psychiatric: normal affect, behavior normal, pleasant      Adult ECG Report  Indication: chest tightness  Rate: 68 bpm  Rhythm: normal sinus rhythm  QRS Axis: 12 degrees  PR Interval: 172ms  QRS Duration: 83ms  QTc: 448ms  Conduction Disturbances: none  Other Abnormalities: nonspecific T wave flatterning  Patient's cardiac risk factors are: none.  EKG comparison: none  Narrative Interpretation: normal EKG     Assessment:   Encounter Diagnoses  Name Primary?  . Chest pressure Yes  . Dyspnea, unspecified type   . Headache syndrome   . Stress at work   . Gastroesophageal reflux disease without esophagitis      Plan:    discussed her symptoms,  concerns, EKG findings.   discussed case with Dr. Redmond School, supervising physical.   Discussed differential.  Likely combination of anxiety, GERD, chest wall pain, but can't rule out other.    Labs today.  Begin mediations below.  discussed symptoms that would prompt call to 911.  F/u pending labs.    [name] was seen today for shortness of breath.  Diagnoses and all orders for this visit:  Chest pressure -     EKG 12-Lead -     Basic metabolic panel -     CBC -     TSH -     T4, free -     D-dimer, quantitative (not at [facility]) -     Sedimentation rate  Dyspnea, unspecified type -     EKG 12-Lead -     Basic metabolic panel -     CBC -     TSH -     T4, free -     D-dimer, quantitative (not at [facility]) -     Sedimentation rate  Headache syndrome -     EKG 12-Lead -     Basic metabolic panel -     CBC -     TSH -     T4, free -     D-dimer, quantitative (not at [facility]) -     Sedimentation rate  Stress at work  Gastroesophageal reflux disease without esophagitis  Other orders -     omeprazole (PRILOSEC) 40 MG capsule; Take 1 capsule (40 mg total) by mouth daily. -     ALPRAZolam (XANAX) 0.5 MG tablet; Take 1 tablet (0.5 mg total) by mouth every 8 (eight) hours as needed for anxiety.

## 2016-02-17 ENCOUNTER — Other Ambulatory Visit: Payer: Self-pay | Admitting: Medical

## 2016-02-17 DIAGNOSIS — R0789 Other chest pain: Secondary | ICD-10-CM

## 2016-02-17 DIAGNOSIS — R0602 Shortness of breath: Secondary | ICD-10-CM

## 2016-11-01 DIAGNOSIS — Z01419 Encounter for gynecological examination (general) (routine) without abnormal findings: Secondary | ICD-10-CM | POA: Diagnosis not present

## 2016-11-06 ENCOUNTER — Other Ambulatory Visit: Payer: Self-pay | Admitting: Obstetrics and Gynecology

## 2016-11-06 DIAGNOSIS — N6489 Other specified disorders of breast: Secondary | ICD-10-CM

## 2016-11-07 ENCOUNTER — Ambulatory Visit (INDEPENDENT_AMBULATORY_CARE_PROVIDER_SITE_OTHER): Payer: 59 | Admitting: Medical

## 2016-11-07 ENCOUNTER — Ambulatory Visit
Admission: RE | Admit: 2016-11-07 | Discharge: 2016-11-07 | Disposition: A | Discharge status: Home / Self Care | Payer: 59 | Source: Ambulatory Visit | Attending: Obstetrics and Gynecology | Admitting: Obstetrics and Gynecology

## 2016-11-07 ENCOUNTER — Ambulatory Visit
Admission: RE | Admit: 2016-11-07 | Discharge: 2016-11-07 | Disposition: A | Discharge status: Home / Self Care | Payer: Self-pay | Source: Ambulatory Visit | Attending: Obstetrics and Gynecology | Admitting: Obstetrics and Gynecology

## 2016-11-07 ENCOUNTER — Encounter: Payer: Self-pay | Admitting: Medical

## 2016-11-07 VITALS — BP 126/80 | HR 80 | Wt 154.4 lb

## 2016-11-07 DIAGNOSIS — N6001 Solitary cyst of right breast: Secondary | ICD-10-CM | POA: Diagnosis not present

## 2016-11-07 DIAGNOSIS — R0609 Other forms of dyspnea: Secondary | ICD-10-CM | POA: Diagnosis not present

## 2016-11-07 DIAGNOSIS — R0789 Other chest pain: Secondary | ICD-10-CM | POA: Diagnosis not present

## 2016-11-07 DIAGNOSIS — N6489 Other specified disorders of breast: Secondary | ICD-10-CM

## 2016-11-07 DIAGNOSIS — R0600 Dyspnea, unspecified: Secondary | ICD-10-CM

## 2016-11-07 DIAGNOSIS — R928 Other abnormal and inconclusive findings on diagnostic imaging of breast: Secondary | ICD-10-CM | POA: Diagnosis not present

## 2016-11-07 MED ORDER — ALBUTEROL SULFATE HFA 108 (90 BASE) MCG/ACT IN AERS: 2.0000 | INHALATION_SPRAY | Freq: Four times a day (QID) | RESPIRATORY_TRACT | 0 refills | Status: DC | PRN

## 2016-11-07 MED ORDER — BUDESONIDE-FORMOTEROL FUMARATE 160-4.5 MCG/ACT IN AERO: 2.0000 | INHALATION_SPRAY | Freq: Two times a day (BID) | RESPIRATORY_TRACT | 0 refills | Status: DC

## 2016-11-07 NOTE — Progress Notes (Signed)
Subjective:    Jennifer Solomon is a 41 y.o. female who presents for evaluation of SOB, DOE.  Here with husband today.  For months now having lots of SOB, can't walk very far without SOB.  No chest pain, no palpations, no wheezing, no edema.   Nonsmoker.  Mother dose have asthma.  However, she has never had asthma or lung disease.  Had her routine gynecology visit recently and they noted heart murmur.   Works in call center.  Patient's cardiac risk factors are: none. Patient's risk factors for DVT/PE: none. Previous cardiac testing: none.  No other aggravating or relieving factors. No other complaint.  Past Medical History:  Diagnosis Date  . Anemia   . Anxiety    very mild  . Chronic headaches   . Eczema   . Hypertension    in remote past, resolved  . Urinary tract infection, chronic    No current outpatient prescriptions on file prior to visit.   No current facility-administered medications on file prior to visit.     The following portions of the patient's history were reviewed and updated as appropriate: allergies, current medications, past family history, past medical history, past social history, past surgical history and problem list.  Review of Systems Constitutional: -fever, -chills, -sweats, -unexpected weight change, +fatigue ENT: -runny nose, -ear pain, -sore throat Cardiology:  -chest pain, -palpitations, -edema Respiratory: -cough, +shortness of breath, -wheezing Gastroenterology: -abdominal pain, -nausea, -vomiting, -diarrhea, -constipation  Hematology: -bleeding or bruising problems Neurology: -headache, -weakness, -tingling, -numbness    Objective:   BP 126/80   Pulse 80   Wt 154 lb 6.4 oz (70 kg)   LMP 10/14/2016   SpO2 98%   BMI 24.92 kg/m   General appearance: alert, no distress, WD/WN,AA female  HEENT: normocephalic, conjunctiva/corneas normal, sclerae anicteric, PERRLA, EOMi, nares patent, no discharge or erythema, pharynx normal Oral cavity:  MMM, tongue normal, teeth normal Neck: supple, no lymphadenopathy, no thyromegaly, no JVD, no bruits, no masses Chest: non tender, normal shape and expansion Heart: RRR, normal S1, S2, no murmurs Lungs: decreased breath sounds in general, no wheezes, rhonchi, or rales Extremities: no edema, no cyanosis, no clubbing Pulses: 2+ symmetric, upper and lower extremities, normal cap refill     Assessment:   Encounter Diagnoses  Name Primary?  . DOE (dyspnea on exertion) Yes  . Dyspnea, unspecified type   . Chest pressure      Plan:   Reviewed 02/2016 visit for similar concerns.  No obvious murmur today but PFTs definitely abnormal.   Gave 1 round of albuterol nebulized treatment.  She did note improvement.    Begin trial of Sybmicort, gave demonstration, discussed proper use of medication  Begin trial of albuterol rescue inhaler q4-6 hours.    Go for CXR  Recheck 1-2 weeks, consider Alpha 1 antitrypsin lab and repeat PFT next visit  Brice was seen today for heart murmur.  Diagnoses and all orders for this visit:  DOE (dyspnea on exertion) -     DG Chest 2 View; Future  Dyspnea, unspecified type -     DG Chest 2 View; Future  Chest pressure -     Spirometry with Graph -     DG Chest 2 View; Future  Other orders -     budesonide-formoterol (SYMBICORT) 160-4.5 MCG/ACT inhaler; Inhale 2 puffs into the lungs 2 (two) times daily. -     albuterol (PROVENTIL HFA;VENTOLIN HFA) 108 (90 Base) MCG/ACT inhaler; Inhale 2 puffs into  the lungs every 6 (six) hours as needed for wheezing or shortness of breath.

## 2016-11-08 ENCOUNTER — Ambulatory Visit
Admission: RE | Admit: 2016-11-08 | Discharge: 2016-11-08 | Disposition: A | Discharge status: Home / Self Care | Payer: 59 | Source: Ambulatory Visit | Attending: Medical | Admitting: Medical

## 2016-11-08 DIAGNOSIS — R0600 Dyspnea, unspecified: Secondary | ICD-10-CM | POA: Diagnosis not present

## 2016-11-08 DIAGNOSIS — R0789 Other chest pain: Secondary | ICD-10-CM | POA: Diagnosis not present

## 2016-11-08 DIAGNOSIS — R0609 Other forms of dyspnea: Principal | ICD-10-CM

## 2016-11-21 ENCOUNTER — Ambulatory Visit: Payer: 59 | Admitting: Medical

## 2016-11-29 ENCOUNTER — Ambulatory Visit (INDEPENDENT_AMBULATORY_CARE_PROVIDER_SITE_OTHER): Payer: 59 | Admitting: Medical

## 2016-11-29 ENCOUNTER — Encounter: Payer: Self-pay | Admitting: Medical

## 2016-11-29 VITALS — BP 126/70 | HR 70 | Wt 154.8 lb

## 2016-11-29 DIAGNOSIS — R0600 Dyspnea, unspecified: Secondary | ICD-10-CM | POA: Diagnosis not present

## 2016-11-29 DIAGNOSIS — Z8349 Family history of other endocrine, nutritional and metabolic diseases: Secondary | ICD-10-CM | POA: Diagnosis not present

## 2016-11-29 LAB — T4, FREE: FREE T4: 1.1 ng/dL (ref 0.8–1.8)

## 2016-11-29 LAB — CBC
HEMATOCRIT: 36.7 % (ref 35.0–45.0)
Hemoglobin: 12.2 g/dL (ref 11.7–15.5)
MCH: 26 pg — ABNORMAL LOW (ref 27.0–33.0)
MCHC: 33.2 g/dL (ref 32.0–36.0)
MCV: 78.1 fL — ABNORMAL LOW (ref 80.0–100.0)
MPV: 9.3 fL (ref 7.5–12.5)
Platelets: 441 10*3/uL — ABNORMAL HIGH (ref 140–400)
RBC: 4.7 MIL/uL (ref 3.80–5.10)
RDW: 15.1 % — AB (ref 11.0–15.0)
WBC: 5.4 10*3/uL (ref 4.0–10.5)

## 2016-11-29 LAB — TSH: TSH: 1.86 mIU/L

## 2016-11-29 NOTE — Progress Notes (Signed)
Subjective: Chief Complaint  Patient presents with  . Follow-up    follow up from asthma , she still having trouble sob   Here for f/u on dyspnea.   Since last visit she has used Symbicort 2 puffs BID and albuterol throughout the day.  With either inhaler, gets a few hours of relief, but still quite dyspneic.  No recent long travel, not on OCPs, no hx/o cancer or thyroid disease or anemia, no recent surgery or injury, no calve pain or swelling she does have family hx/o thyroid disease in mother, and has hx/o HTN in pregnancy.  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 No other aggravating or relieving factors. No other complaint.  From last visit she reported for months now having lots of SOB, can't walk very far without SOB.  No chest pain, no palpations, no wheezing, no edema.   Nonsmoker.  Mother dose have asthma.  However, she has never had asthma or lung disease.  Had her routine gynecology visit recently and they noted heart murmur.   Works in call center.  Patient's cardiac risk factors are: none. Patient's risk factors for DVT/PE: none. Previous cardiac testing: none.  No other aggravating or relieving factors. No other complaint.  Past Medical History:  Diagnosis Date  . Anemia   . Anxiety    very mild  . Chronic headaches   . Eczema   . Hypertension    in remote past, resolved  . Urinary tract infection, chronic    Current Outpatient Prescriptions on File Prior to Visit  Medication Sig Dispense Refill  . albuterol (PROVENTIL HFA;VENTOLIN HFA) 108 (90 Base) MCG/ACT inhaler Inhale 2 puffs into the lungs every 6 (six) hours as needed for wheezing or shortness of breath. 1 Inhaler 0  . budesonide-formoterol (SYMBICORT) 160-4.5 MCG/ACT inhaler Inhale 2 puffs into the lungs 2 (two) times daily. 1 Inhaler 0   No current facility-administered medications on file prior to visit.     The following portions of the  patient's history were reviewed and updated as appropriate: allergies, current medications, past family history, past medical history, past social history, past surgical history and problem list.  Review of Systems Constitutional: -fever, -chills, -sweats, -unexpected weight change, +fatigue ENT: -runny nose, -ear pain, -sore throat Cardiology:  -chest pain, -palpitations, -edema Respiratory: -cough, +shortness of breath, -wheezing Gastroenterology: -abdominal pain, -nausea, -vomiting, -diarrhea, -constipation  Hematology: -bleeding or bruising problems Neurology: -headache, -weakness, -tingling, -numbness    Objective:   BP 126/70   Pulse 70   Wt 154 lb 12.8 oz (70.2 kg)   SpO2 99%   BMI 24.99 kg/m   General appearance: alert, no distress, WD/WN,AA female  HEENT: normocephalic, conjunctiva/corneas normal, sclerae anicteric, PERRLA, EOMi, nares patent, no discharge or erythema, pharynx normal Oral cavity: MMM, tongue normal, teeth normal Neck: supple, no lymphadenopathy, no thyromegaly, no JVD, no bruits, no masses Chest: non tender, normal shape and expansion Heart: RRR, normal S1, S2, no murmurs Lungs: decreased breath sounds in general, no wheezes, rhonchi, or rales Extremities: no edema, no cyanosis, no clubbing Pulses: 2+ symmetric, upper and lower extremities, normal cap refill   Adult ECG Report  Indication: dyspnea  Rate: 74 bpm  Rhythm: normal sinus rhythm  QRS Axis: 50 degrees  PR Interval: 148 ms  QRS Duration: 11ms  QTc: 416ms  Conduction Disturbances: none  Other Abnormalities: none  Patient's cardiac risk factors  are: none.  EKG comparison: 02/2016  Narrative Interpretation: normal EKG     Assessment:   Encounter Diagnoses  Name Primary?  Marland Kitchen Dyspnea, unspecified type Yes  . Family history of thyroid disorder       Plan:   Reviewed 02/2016 visit for similar concerns.  No obvious murmur today but PFTs definitely abnormal last visit. She has had  some improvement Sybmicor and albuterol. Advised she begin OTC antihistamine QHS just to see if this helps any potential allergy trigger.   Labs today for further eval, with likely referral to pulmonology.   Hanaa was seen today for follow-up.  Diagnoses and all orders for this visit:  Dyspnea, unspecified type -     EKG 12-Lead -     Pulse oximetry (single); Future -     D-dimer, quantitative (not at [facility]) -     CBC -     TSH -     T4, free -     Alpha-1-Antitrypsin -     Basic metabolic panel  Family history of thyroid disorder

## 2016-11-30 ENCOUNTER — Ambulatory Visit
Admission: RE | Admit: 2016-11-30 | Discharge: 2016-11-30 | Disposition: A | Discharge status: Home / Self Care | Payer: 59 | Source: Ambulatory Visit | Attending: Medical | Admitting: Medical

## 2016-11-30 ENCOUNTER — Other Ambulatory Visit: Payer: Self-pay

## 2016-11-30 ENCOUNTER — Telehealth: Payer: Self-pay | Admitting: Medical

## 2016-11-30 DIAGNOSIS — R0789 Other chest pain: Secondary | ICD-10-CM

## 2016-11-30 DIAGNOSIS — R0602 Shortness of breath: Secondary | ICD-10-CM | POA: Diagnosis not present

## 2016-11-30 LAB — BASIC METABOLIC PANEL
BUN: 7 mg/dL (ref 7–25)
CHLORIDE: 107 mmol/L (ref 98–110)
CO2: 19 mmol/L — ABNORMAL LOW (ref 20–31)
CREATININE: 0.62 mg/dL (ref 0.50–1.10)
Calcium: 9.7 mg/dL (ref 8.6–10.2)
Glucose, Bld: 76 mg/dL (ref 65–99)
Potassium: 4.6 mmol/L (ref 3.5–5.3)
SODIUM: 140 mmol/L (ref 135–146)

## 2016-11-30 LAB — D-DIMER, QUANTITATIVE: D-Dimer, Quant: 0.61 mcg/mL FEU — ABNORMAL HIGH (ref <0.50)

## 2016-11-30 MED ORDER — IOPAMIDOL (ISOVUE-370) INJECTION 76%
75.0000 mL | Freq: Once | INTRAVENOUS | Status: AC | PRN
Start: 2016-11-30 — End: 2016-11-30
  Administered 2016-11-30: 75 mL via INTRAVENOUS

## 2016-11-30 NOTE — Telephone Encounter (Signed)
Call report from [place]  CT  No acute cardiopulmonary disease, specifically no evidence of PE  Incidental hyper enhancing hepatic lesions;larger on R lobe  suspect hemangiomas, suggest MRI  [facility] informed patient no PE and that the office will follow up with her

## 2016-12-03 ENCOUNTER — Other Ambulatory Visit: Payer: Self-pay

## 2016-12-03 DIAGNOSIS — R0789 Other chest pain: Secondary | ICD-10-CM

## 2016-12-03 DIAGNOSIS — R0600 Dyspnea, unspecified: Secondary | ICD-10-CM

## 2016-12-03 LAB — ALPHA-1-ANTITRYPSIN: A1 ANTITRYPSIN SER: 142 mg/dL (ref 83–199)

## 2016-12-10 ENCOUNTER — Telehealth: Payer: Self-pay

## 2016-12-10 NOTE — Telephone Encounter (Signed)
Pt dropped off FMLA paperwork this am and wanted you to know that she is not able to attend pulmonary appt until these forms are completed. She needs them completed ASAP. Thank you, Wells Guiles

## 2016-12-12 ENCOUNTER — Telehealth: Payer: Self-pay

## 2016-12-12 NOTE — Telephone Encounter (Signed)
LM for pt to CB- FMLA forms at front desk for pick up. Victorino December

## 2016-12-18 ENCOUNTER — Telehealth: Payer: Self-pay | Admitting: Medical

## 2016-12-18 NOTE — Telephone Encounter (Signed)
I thought we already did some forms?

## 2016-12-18 NOTE — Telephone Encounter (Signed)
Pt came and dropped off incomplete FMLA forms. Sending back to [place] per office protocol. Please fax to [phone] when ready.

## 2016-12-19 NOTE — Telephone Encounter (Signed)
Please see Jennifer Solomon. Apparently they rejected them due to something being incomplete. I took back to [place] yesterday.

## 2016-12-20 NOTE — Telephone Encounter (Signed)
Pt came by the office an  P/u  Form and I faxed forms for her one day last week

## 2016-12-21 NOTE — Telephone Encounter (Signed)
She bring them by on Tuesday.

## 2016-12-21 NOTE — Telephone Encounter (Signed)
I haven't seen these yet since the first round of forms

## 2017-06-24 DIAGNOSIS — M545 Low back pain: Secondary | ICD-10-CM | POA: Diagnosis not present

## 2017-06-24 DIAGNOSIS — M62830 Muscle spasm of back: Secondary | ICD-10-CM | POA: Diagnosis not present

## 2019-02-19 ENCOUNTER — Emergency Department (HOSPITAL_COMMUNITY): Payer: Managed Care, Other (non HMO)

## 2019-02-19 ENCOUNTER — Encounter (HOSPITAL_COMMUNITY): Payer: Self-pay | Admitting: Emergency Medicine

## 2019-02-19 DIAGNOSIS — Y999 Unspecified external cause status: Secondary | ICD-10-CM | POA: Diagnosis not present

## 2019-02-19 DIAGNOSIS — Z79899 Other long term (current) drug therapy: Secondary | ICD-10-CM | POA: Insufficient documentation

## 2019-02-19 DIAGNOSIS — Y9389 Activity, other specified: Secondary | ICD-10-CM | POA: Diagnosis not present

## 2019-02-19 DIAGNOSIS — S161XXA Strain of muscle, fascia and tendon at neck level, initial encounter: Secondary | ICD-10-CM | POA: Diagnosis not present

## 2019-02-19 DIAGNOSIS — I10 Essential (primary) hypertension: Secondary | ICD-10-CM | POA: Insufficient documentation

## 2019-02-19 DIAGNOSIS — R0789 Other chest pain: Secondary | ICD-10-CM | POA: Diagnosis not present

## 2019-02-19 DIAGNOSIS — E041 Nontoxic single thyroid nodule: Secondary | ICD-10-CM | POA: Diagnosis not present

## 2019-02-19 DIAGNOSIS — S098XXA Other specified injuries of head, initial encounter: Secondary | ICD-10-CM | POA: Diagnosis present

## 2019-02-19 DIAGNOSIS — Y92410 Unspecified street and highway as the place of occurrence of the external cause: Secondary | ICD-10-CM | POA: Diagnosis not present

## 2019-02-19 NOTE — ED Notes (Signed)
WARM BLANKET AND WARM COMPRESS GIVEN. PT ABLE TO CALM SELF. ENCOURAGE TO RELAX. FAMILY WITNESSED PT SELF CALM. PT TAKEN TO X-RAY. NO ACUTE DISTRESS
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
## 2019-02-20 ENCOUNTER — Other Ambulatory Visit: Payer: Self-pay

## 2019-02-20 ENCOUNTER — Emergency Department (HOSPITAL_COMMUNITY): Payer: Managed Care, Other (non HMO)

## 2019-02-20 ENCOUNTER — Encounter: Payer: Self-pay | Admitting: Medical

## 2019-02-20 ENCOUNTER — Other Ambulatory Visit: Payer: Self-pay | Admitting: Medical

## 2019-02-20 ENCOUNTER — Emergency Department (HOSPITAL_COMMUNITY)
Admission: EM | Admit: 2019-02-20 | Discharge: 2019-02-20 | Disposition: A | Discharge status: Home / Self Care | Payer: Managed Care, Other (non HMO) | Attending: Emergency Medicine | Admitting: Emergency Medicine

## 2019-02-20 ENCOUNTER — Telehealth: Payer: Self-pay | Admitting: Medical

## 2019-02-20 ENCOUNTER — Ambulatory Visit: Payer: Managed Care, Other (non HMO) | Admitting: Medical

## 2019-02-20 VITALS — BP 130/88 | HR 69 | Temp 98.0°F | Ht 66.0 in | Wt 142.0 lb

## 2019-02-20 DIAGNOSIS — M549 Dorsalgia, unspecified: Secondary | ICD-10-CM

## 2019-02-20 DIAGNOSIS — E041 Nontoxic single thyroid nodule: Secondary | ICD-10-CM | POA: Insufficient documentation

## 2019-02-20 DIAGNOSIS — R0789 Other chest pain: Secondary | ICD-10-CM

## 2019-02-20 DIAGNOSIS — S161XXA Strain of muscle, fascia and tendon at neck level, initial encounter: Secondary | ICD-10-CM

## 2019-02-20 DIAGNOSIS — M542 Cervicalgia: Secondary | ICD-10-CM | POA: Insufficient documentation

## 2019-02-20 DIAGNOSIS — M25519 Pain in unspecified shoulder: Secondary | ICD-10-CM | POA: Insufficient documentation

## 2019-02-20 DIAGNOSIS — R519 Headache, unspecified: Secondary | ICD-10-CM | POA: Insufficient documentation

## 2019-02-20 DIAGNOSIS — R935 Abnormal findings on diagnostic imaging of other abdominal regions, including retroperitoneum: Secondary | ICD-10-CM

## 2019-02-20 MED ORDER — METHOCARBAMOL 500 MG PO TABS
1000.0000 mg | ORAL_TABLET | Freq: Once | ORAL | Status: AC
  Administered 2019-02-20: 04:00:00 1000 mg via ORAL
  Filled 2019-02-20: qty 2

## 2019-02-20 MED ORDER — IBUPROFEN 800 MG PO TABS: 800.0000 mg | ORAL_TABLET | Freq: Three times a day (TID) | ORAL | 0 refills | Status: DC | PRN

## 2019-02-20 MED ORDER — IBUPROFEN 800 MG PO TABS: 800.0000 mg | ORAL_TABLET | Freq: Three times a day (TID) | ORAL | 0 refills | Status: AC | PRN

## 2019-02-20 MED ORDER — METHOCARBAMOL 500 MG PO TABS: 500.0000 mg | ORAL_TABLET | Freq: Three times a day (TID) | ORAL | 0 refills | Status: DC | PRN

## 2019-02-20 MED ORDER — IBUPROFEN 800 MG PO TABS
800.0000 mg | ORAL_TABLET | Freq: Once | ORAL | Status: AC
  Administered 2019-02-20: 800 mg via ORAL
  Filled 2019-02-20: qty 1
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
## 2019-02-20 NOTE — Discharge Instructions (Addendum)
You may alternate Tylenol 1000 mg every 6 hours as needed for pain and Ibuprofen 800 mg every 8 hours as needed for pain.  Please take Ibuprofen with food.   You were found to have a 1.5 cm thyroid nodule in your left thyroid.  This was an incidental finding on your CT scan.  Recommend follow-up with your primary care physician as they may recommend blood test for further evaluation as well as an ultrasound.
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
## 2019-02-20 NOTE — Progress Notes (Unsigned)
thyrid
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
## 2019-02-23 ENCOUNTER — Telehealth: Payer: Self-pay | Admitting: Medical

## 2019-02-23 ENCOUNTER — Other Ambulatory Visit: Payer: Self-pay | Admitting: Medical

## 2019-02-23 MED ORDER — TRAMADOL HCL 50 MG PO TABS: 50.0000 mg | ORAL_TABLET | Freq: Four times a day (QID) | ORAL | 0 refills | Status: AC | PRN

## 2019-02-23 NOTE — Telephone Encounter (Signed)
Medication Ultram sent for stronger pain mediation
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
## 2019-02-24 NOTE — Telephone Encounter (Signed)
Patient informed. 

## 2019-04-20 ENCOUNTER — Other Ambulatory Visit: Payer: Self-pay | Admitting: Family Medicine

## 2019-04-20 ENCOUNTER — Other Ambulatory Visit (HOSPITAL_COMMUNITY): Payer: Self-pay | Admitting: Family Medicine

## 2019-04-20 DIAGNOSIS — E041 Nontoxic single thyroid nodule: Secondary | ICD-10-CM

## 2019-04-23 ENCOUNTER — Ambulatory Visit (HOSPITAL_COMMUNITY): Payer: Managed Care, Other (non HMO)

## 2019-04-23 ENCOUNTER — Ambulatory Visit (HOSPITAL_COMMUNITY): Payer: Managed Care, Other (non HMO) | Attending: Family Medicine

## 2019-06-04 ENCOUNTER — Other Ambulatory Visit: Payer: Self-pay | Admitting: Family Medicine

## 2019-06-04 ENCOUNTER — Other Ambulatory Visit (HOSPITAL_COMMUNITY)
Admission: RE | Admit: 2019-06-04 | Discharge: 2019-06-04 | Disposition: A | Discharge status: Home / Self Care | Payer: Managed Care, Other (non HMO) | Source: Ambulatory Visit | Attending: Family Medicine | Admitting: Family Medicine

## 2019-06-04 DIAGNOSIS — Z124 Encounter for screening for malignant neoplasm of cervix: Secondary | ICD-10-CM | POA: Insufficient documentation

## 2019-06-10 ENCOUNTER — Other Ambulatory Visit: Payer: Self-pay

## 2019-06-10 ENCOUNTER — Ambulatory Visit (HOSPITAL_COMMUNITY)
Admission: RE | Admit: 2019-06-10 | Discharge: 2019-06-10 | Disposition: A | Discharge status: Home / Self Care | Payer: Managed Care, Other (non HMO) | Source: Ambulatory Visit | Attending: Family Medicine | Admitting: Family Medicine

## 2019-06-10 DIAGNOSIS — E041 Nontoxic single thyroid nodule: Secondary | ICD-10-CM | POA: Diagnosis not present

## 2019-06-11 LAB — CYTOLOGY - PAP
Comment: NEGATIVE
Diagnosis: NEGATIVE
High risk HPV: NEGATIVE

## 2020-12-20 ENCOUNTER — Encounter: Payer: Self-pay | Admitting: Family Medicine

## 2020-12-20 ENCOUNTER — Ambulatory Visit (INDEPENDENT_AMBULATORY_CARE_PROVIDER_SITE_OTHER): Payer: Managed Care, Other (non HMO) | Admitting: Family Medicine

## 2020-12-20 ENCOUNTER — Other Ambulatory Visit: Payer: Self-pay

## 2020-12-20 VITALS — BP 100/70 | HR 86 | Ht 66.0 in | Wt 149.0 lb

## 2020-12-20 DIAGNOSIS — Z1211 Encounter for screening for malignant neoplasm of colon: Secondary | ICD-10-CM

## 2020-12-20 DIAGNOSIS — Z0000 Encounter for general adult medical examination without abnormal findings: Secondary | ICD-10-CM

## 2020-12-20 DIAGNOSIS — Z7689 Persons encountering health services in other specified circumstances: Secondary | ICD-10-CM | POA: Diagnosis not present

## 2020-12-20 NOTE — Progress Notes (Signed)
ROI completed to Dr. Lucianne Lei. ROI given to [employer] in the front office to process. Christen Bame, CMA

## 2020-12-20 NOTE — Patient Instructions (Addendum)
It was great to see you!  Things we discussed at today's visit: - I placed a referral to GI for your colonoscopy (colon cancer screening). Someone should reach out to you about scheduling this.  - I would highly recommend the COVID vaccine. If you decide you're ready to get it, please call our office and we can set up an appointment - Aim for 150 minutes of exercise per week. You can help break up your work day with 5-10 minutes of walking every 1-2 hours. - It's always a good idea to think about who you would want to make medical decisions for you if you weren't able to make them yourself. It's a good idea to discuss with that person what you would/wouldn't want (breathing tube, CPR, feeding tube, etc). - I will get your medical records from Dr. Fransico Setters office. If you need any blood work we will let you know and can schedule a separate appointment.   Take care and seek immediate care sooner if you develop any concerns.  Dr. [facility]

## 2020-12-20 NOTE — Progress Notes (Signed)
   Subjective:   CC: Establish care  HPI:  Jennifer Solomon is a very pleasant 45 y.o. female who presents today to establish care.  Prior PCP: Chana Bode, Dr. 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 normal heart sounds, no murmurs. Respiratory: CTAB, normal effort, No wheezes, rales or rhonchi Extremities: no edema or cyanosis. Skin: warm and dry, no rashes noted Neuro: alert, no obvious focal deficits Psych: Normal affect and mood   Assessment & Plan:   Health Maintenance Establish with new PCP Patient without specific concerns today. Vitals reviewed and are appropriate. BMI wnl. PHQ-9 negative. -Will obtain records from prior PCP (Dr Criss Rosales). If no recent labs, she would benefit from lipid panel at a minimum. -Colon cancer screening discussed today. Patient opted to pursue colonoscopy. Order placed. -Patient declines COVID vaccine today but is going to consider it -Discussed advanced directives -UTD on remainder of health maintenance including pap  Thyroid Nodule Thyroid ultrasound  in January 2021 showed two nodules, one of which did not meet criteria for any follow up. It recommended FNA of the other nodule. Unclear whether this was pursued. Will obtain prior records as above. May need additional follow-up on this thyroid nodule if not previously completed.   Alcus Dad, MD [place] PGY-2

## 2020-12-21 ENCOUNTER — Encounter: Payer: Self-pay | Admitting: Family Medicine

## 2021-02-22 ENCOUNTER — Encounter: Payer: Self-pay | Admitting: Family Medicine

## 2021-06-12 ENCOUNTER — Encounter: Payer: Self-pay | Admitting: Family Medicine
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
## 2021-12-13 ENCOUNTER — Ambulatory Visit (AMBULATORY_SURGERY_CENTER): Payer: Self-pay | Admitting: *Deleted

## 2021-12-13 ENCOUNTER — Other Ambulatory Visit: Payer: Self-pay

## 2021-12-13 VITALS — Ht 66.0 in | Wt 150.0 lb

## 2021-12-13 DIAGNOSIS — Z1211 Encounter for screening for malignant neoplasm of colon: Secondary | ICD-10-CM

## 2021-12-13 MED ORDER — NA SULFATE-K SULFATE-MG SULF 17.5-3.13-1.6 GM/177ML PO SOLN: 1.0000 | Freq: Once | ORAL | 0 refills | Status: AC

## 2021-12-13 NOTE — Progress Notes (Signed)
Virtual pre visit completed in person No egg or soy allergy known to patient  No issues known to pt with past sedation with any surgeries or procedures Patient denies ever being told they had issues or difficulty with intubation  No FH of Malignant Hyperthermia Pt is not on diet pills Pt is not on  home 02  Pt is not on blood thinners  Pt denies issues with constipation  No A fib or A flutter Pt instructed to use Singlecare.com or GoodRx for a price reduction on prep

## 2021-12-28 ENCOUNTER — Encounter: Payer: Self-pay | Admitting: Gastroenterology

## 2022-01-03 ENCOUNTER — Ambulatory Visit: Payer: Managed Care, Other (non HMO) | Admitting: Gastroenterology

## 2022-01-03 ENCOUNTER — Encounter: Payer: Self-pay | Admitting: Gastroenterology

## 2022-01-03 VITALS — BP 131/77 | HR 70 | Temp 100.0°F | Resp 14 | Ht 66.0 in | Wt 150.0 lb

## 2022-01-03 DIAGNOSIS — D125 Benign neoplasm of sigmoid colon: Secondary | ICD-10-CM

## 2022-01-03 DIAGNOSIS — K635 Polyp of colon: Secondary | ICD-10-CM | POA: Diagnosis not present

## 2022-01-03 DIAGNOSIS — Z1211 Encounter for screening for malignant neoplasm of colon: Secondary | ICD-10-CM

## 2022-01-03 MED ORDER — SODIUM CHLORIDE 0.9 % IV SOLN: 500.0000 mL | INTRAVENOUS | Status: DC

## 2022-01-03 NOTE — Progress Notes (Signed)
Sedate, gd SR, tolerated procedure well, VSS, report to RN 

## 2022-01-03 NOTE — Progress Notes (Signed)
Called to room to assist during endoscopic procedure.  Patient ID and intended procedure confirmed with present staff. Received instructions for my participation in the procedure from the performing physician.  

## 2022-01-03 NOTE — Op Note (Signed)
[facility] Patient Name: Jennifer Solomon Procedure Date: 01/03/2022 8:45 AM MRN: [id number] Endoscopist: Remo Lipps P. Havery Moros , MD Age: 46 Referring MD:  Date of Birth: 19-May-1975 Gender: Female Account #: [id number] Procedure:                Colonoscopy Indications:              Screening for colorectal malignant neoplasm, This                            is the patient's first colonoscopy Medicines:                Monitored Anesthesia Care Procedure:                Pre-Anesthesia Assessment:                           - Prior to the procedure, a History and Physical                            was performed, and patient medications and                            allergies were reviewed. The patient's tolerance of                            previous anesthesia was also reviewed. The risks                            and benefits of the procedure and the sedation                            options and risks were discussed with the patient.                            All questions were answered, and informed consent                            was obtained. Prior Anticoagulants: The patient has                            taken no previous anticoagulant or antiplatelet                            agents. ASA Grade Assessment: II - A patient with                            mild systemic disease. After reviewing the risks                            and benefits, the patient was deemed in                            satisfactory condition to undergo the procedure.  After obtaining informed consent, the colonoscope                            was passed under direct vision. Throughout the                            procedure, the patient's blood pressure, pulse, and                            oxygen saturations were monitored continuously. The                            PCF-HQ190L Colonoscope was introduced through the                            anus and  advanced to the the cecum, identified by                            appendiceal orifice and ileocecal valve. The                            colonoscopy was performed without difficulty. The                            patient tolerated the procedure well. The quality                            of the bowel preparation was good. The ileocecal                            valve, appendiceal orifice, and rectum were                            photographed. Scope In: 8:54:12 AM Scope Out: 9:22:35 AM Scope Withdrawal Time: 0 hours 23 minutes 59 seconds  Total Procedure Duration: 0 hours 28 minutes 23 seconds  Findings:                 Skin tags and prolapsing internal hemorrhoids were                            found on perianal exam.                           The left colon was tortuous.                           A 3 mm polyp was found in the sigmoid colon. The                            polyp was sessile. The polyp was removed with a                            cold snare. Resection and retrieval were complete.  Internal hemorrhoids were found during                            retroflexion. There was a skin tag that prolapsed                            internally with retroflexion of the scope.                           The exam was otherwise without abnormality. Complications:            No immediate complications. Estimated blood loss:                            Minimal. Estimated Blood Loss:     Estimated blood loss was minimal. Impression:               - Perianal skin tags and prolapsing internal                            hemorrhoids found on perianal exam.                           - Tortuous colon.                           - One 3 mm polyp in the sigmoid colon, removed with                            a cold snare. Resected and retrieved.                           - Internal hemorrhoids.                           - The examination was otherwise  normal. Recommendation:           - Patient has a contact number available for                            emergencies. The signs and symptoms of potential                            delayed complications were discussed with the                            patient. Return to normal activities tomorrow.                            Written discharge instructions were provided to the                            patient.                           - Resume previous diet.                           -  Continue present medications.                           - Await pathology results. Remo Lipps P. Havery Moros, MD 01/03/2022 9:27:37 AM This report has been signed electronically.

## 2022-01-03 NOTE — Patient Instructions (Signed)
Handout on hemorrhoids and polyps given to patient. Await pathology results. Resume previous diet and continue present medications. Repeat colonoscopy for surveillance will be determined based off of pathology results.   YOU HAD AN ENDOSCOPIC PROCEDURE TODAY AT [facility]:   Refer to the procedure report that was given to you for any specific questions about what was found during the examination.  If the procedure report does not answer your questions, please call your gastroenterologist to clarify.  If you requested that your care partner not be given the details of your procedure findings, then the procedure report has been included in a sealed envelope for you to review at your convenience later.  YOU SHOULD EXPECT: Some feelings of bloating in the abdomen. Passage of more gas than usual.  Walking can help get rid of the air that was put into your GI tract during the procedure and reduce the bloating. If you had a lower endoscopy (such as a colonoscopy or flexible sigmoidoscopy) you may notice spotting of blood in your stool or on the toilet paper. If you underwent a bowel prep for your procedure, you may not have a normal bowel movement for a few days.  Please Note:  You might notice some irritation and congestion in your nose or some drainage.  This is from the oxygen used during your procedure.  There is no need for concern and it should clear up in a day or so.  SYMPTOMS TO REPORT IMMEDIATELY:  Following lower endoscopy (colonoscopy or flexible sigmoidoscopy):  Excessive amounts of blood in the stool  Significant tenderness or worsening of abdominal pains  Swelling of the abdomen that is new, acute  Fever of 100F or higher  For urgent or emergent issues, a gastroenterologist can be reached at any hour by calling [phone]. Do not use MyChart messaging for urgent concerns.    DIET:  We do recommend a small meal at first, but then you may proceed to your regular  diet.  Drink plenty of fluids but you should avoid alcoholic beverages for 24 hours.  ACTIVITY:  You should plan to take it easy for the rest of today and you should NOT DRIVE or use heavy machinery until tomorrow (because of the sedation medicines used during the test).    FOLLOW UP: Our staff will call the number listed on your records the next business day following your procedure.  We will call around 7:15- 8:00 am to check on you and address any questions or concerns that you may have regarding the information given to you following your procedure. If we do not reach you, we will leave a message.  If you develop any symptoms (ie: fever, flu-like symptoms, shortness of breath, cough etc.) before then, please call [phone].  If you test positive for Covid 19 in the 2 weeks post procedure, please call and report this information to [place].    If any biopsies were taken you will be contacted by phone or by letter within the next 1-3 weeks.  Please call us at [phone] if you have not heard about the biopsies in 3 weeks.    SIGNATURES/CONFIDENTIALITY: You and/or your care partner have signed paperwork which will be entered into your electronic medical record.  These signatures attest to the fact that that the information above on your After Visit Summary has been reviewed and is understood.  Full responsibility of the confidentiality of this discharge information lies with you and/or your care-partner.

## 2022-01-03 NOTE — Progress Notes (Signed)
[facility] History and Physical   Primary Care Physician:  Alcus Dad, MD   Reason for Procedure:   Colon cancer screening  Plan:    colonoscopy     HPI: Jennifer Solomon is a 46 y.o. female  here for colonoscopy screening - first time exam. Patient denies any bowel symptoms at this time. No family history of colon cancer known. Otherwise feels well without any cardiopulmonary symptoms.   I have discussed risks / benefits of anesthesia and endoscopic procedure with [place] and they wish to proceed with the exams as outlined today.    Past Medical History:  Diagnosis Date   Anemia    Anxiety    very mild   Chronic headaches    Eczema     Past Surgical History:  Procedure Laterality Date   TUBAL LIGATION      Prior to Admission medications   Medication Sig Start Date End Date Taking? Authorizing Provider  ibuprofen (ADVIL) 800 MG tablet Take 1 tablet (800 mg total) by mouth every 8 (eight) hours as needed for mild pain. 02/20/19   Ward, Delice Bison, DO    Current Outpatient Medications  Medication Sig Dispense Refill   ibuprofen (ADVIL) 800 MG tablet Take 1 tablet (800 mg total) by mouth every 8 (eight) hours as needed for mild pain. 30 tablet 0   Current Facility-Administered Medications  Medication Dose Route Frequency Provider Last Rate Last Admin   0.9 %  sodium chloride infusion  500 mL Intravenous Continuous Jarreau Callanan, Carlota Raspberry, MD        Allergies as of 01/03/2022   (No Known Allergies)    Family History  Problem Relation Age of Onset   Thyroid disease Mother    Hypertension Mother    Cancer Mother        cervical   Hypothyroidism Mother    Thyroid disease Maternal Grandmother    Cancer Maternal Grandmother        cervical   Heart disease Maternal Grandmother    Diabetes Maternal Grandmother    Stroke Neg Hx    Colon cancer Neg Hx    Esophageal cancer Neg Hx    Rectal cancer Neg Hx    Stomach cancer Neg Hx      Social History   Socioeconomic History   Marital status: Single    Spouse name: Not on file   Number of children: Not on file   Years of education: Not on file   Highest education level: Not on file  Occupational History   Not on file  Tobacco Use   Smoking status: Never   Smokeless tobacco: Never  Vaping Use   Vaping Use: Never used  Substance and Sexual Activity   Alcohol use: Not Currently    Comment: 2- 3 drinks a month   Drug use: Never   Sexual activity: Yes    Birth control/protection: Surgical    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  Physical Exam: Vital signs BP 133/76   Pulse 72   Temp 100 F (37.8 C)   Ht '5\' 6"'$  (1.676 m)   Wt 150 lb (68 kg)   LMP 12/18/2021 Comment: BTL  SpO2 100%   BMI 24.21 kg/m   General:   Alert,  Well-developed, pleasant and cooperative in NAD Lungs:  Clear throughout to auscultation.   Heart:  Regular rate and rhythm Abdomen:  Soft, nontender and nondistended.   Neuro/Psych:  Alert and cooperative. Normal mood and affect. A and O x 3  Jolly Mango, MD [facility]

## 2022-01-04 ENCOUNTER — Telehealth: Payer: Self-pay

## 2022-01-04 NOTE — Telephone Encounter (Signed)
  Follow up Call-     01/03/2022    8:24 AM  Call back number  Post procedure Call Back phone  # [phone]  Permission to leave phone message Yes     Patient questions:  Do you have a fever, pain , or abdominal swelling? No. Pain Score  0 *  Have you tolerated food without any problems? Yes.    Have you been able to return to your normal activities? Yes.    Do you have any questions about your discharge instructions: Diet   No. Medications  No. Follow up visit  No.  Do you have questions or concerns about your Care? No.  Actions: * If pain score is 4 or above: No action needed, pain <4.

## 2022-01-09 ENCOUNTER — Encounter: Payer: Self-pay | Admitting: Gastroenterology

## 2022-01-25 ENCOUNTER — Encounter: Payer: Self-pay | Admitting: Family Medicine

## 2022-10-02 ENCOUNTER — Encounter: Payer: Managed Care, Other (non HMO) | Admitting: Family Medicine
